# Patient Record
Sex: Male | Born: 1980 | Hispanic: Yes | Marital: Single | Smoking: Current every day smoker
Health system: Southern US, Community
[De-identification: ages and names within clinical notes are randomized; demographics above are authoritative.]

---

## 2018-08-01 ENCOUNTER — Other Ambulatory Visit: Payer: Self-pay

## 2018-08-01 ENCOUNTER — Emergency Department (HOSPITAL_COMMUNITY)
Admission: EM | Admit: 2018-08-01 | Discharge: 2018-08-01 | Disposition: A | Discharge status: Home / Self Care | Payer: Self-pay | Attending: Emergency Medicine | Admitting: Emergency Medicine

## 2018-08-01 ENCOUNTER — Encounter (HOSPITAL_COMMUNITY): Payer: Self-pay | Admitting: *Deleted

## 2018-08-01 ENCOUNTER — Emergency Department (HOSPITAL_COMMUNITY): Payer: Self-pay

## 2018-08-01 DIAGNOSIS — W260XXA Contact with knife, initial encounter: Secondary | ICD-10-CM | POA: Insufficient documentation

## 2018-08-01 DIAGNOSIS — S41111A Laceration without foreign body of right upper arm, initial encounter: Secondary | ICD-10-CM

## 2018-08-01 DIAGNOSIS — Y9301 Activity, walking, marching and hiking: Secondary | ICD-10-CM | POA: Insufficient documentation

## 2018-08-01 DIAGNOSIS — Y999 Unspecified external cause status: Secondary | ICD-10-CM | POA: Insufficient documentation

## 2018-08-01 DIAGNOSIS — Y929 Unspecified place or not applicable: Secondary | ICD-10-CM | POA: Insufficient documentation

## 2018-08-01 DIAGNOSIS — S4991XA Unspecified injury of right shoulder and upper arm, initial encounter: Secondary | ICD-10-CM

## 2018-08-01 DIAGNOSIS — Z23 Encounter for immunization: Secondary | ICD-10-CM | POA: Insufficient documentation

## 2018-08-01 DIAGNOSIS — S51811A Laceration without foreign body of right forearm, initial encounter: Secondary | ICD-10-CM | POA: Insufficient documentation

## 2018-08-01 DIAGNOSIS — F17210 Nicotine dependence, cigarettes, uncomplicated: Secondary | ICD-10-CM | POA: Insufficient documentation

## 2018-08-01 MED ORDER — TETANUS-DIPHTH-ACELL PERTUSSIS 5-2.5-18.5 LF-MCG/0.5 IM SUSP
0.5000 mL | Freq: Once | INTRAMUSCULAR | Status: AC
  Administered 2018-08-01: 0.5 mL via INTRAMUSCULAR
  Filled 2018-08-01: qty 0.5

## 2018-08-01 MED ORDER — LIDOCAINE-EPINEPHRINE (PF) 2 %-1:200000 IJ SOLN
10.0000 mL | Freq: Once | INTRAMUSCULAR | Status: AC
  Administered 2018-08-01: 10 mL via INTRADERMAL
  Filled 2018-08-01: qty 20

## 2018-08-01 NOTE — ED Triage Notes (Signed)
Unable to locate the pt in the waiting room

## 2018-08-01 NOTE — ED Notes (Signed)
Patient transported to X-ray 
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
## 2018-08-12 ENCOUNTER — Encounter (HOSPITAL_COMMUNITY): Payer: Self-pay

## 2018-08-12 ENCOUNTER — Emergency Department (HOSPITAL_COMMUNITY)
Admission: EM | Admit: 2018-08-12 | Discharge: 2018-08-12 | Disposition: A | Discharge status: Home / Self Care | Payer: Self-pay | Attending: Emergency Medicine | Admitting: Emergency Medicine

## 2018-08-12 DIAGNOSIS — W260XXA Contact with knife, initial encounter: Secondary | ICD-10-CM | POA: Insufficient documentation

## 2018-08-12 DIAGNOSIS — Z4802 Encounter for removal of sutures: Secondary | ICD-10-CM | POA: Insufficient documentation

## 2018-08-12 DIAGNOSIS — S40021A Contusion of right upper arm, initial encounter: Secondary | ICD-10-CM | POA: Insufficient documentation

## 2018-08-12 DIAGNOSIS — F17200 Nicotine dependence, unspecified, uncomplicated: Secondary | ICD-10-CM | POA: Insufficient documentation

## 2018-08-12 DIAGNOSIS — Y998 Other external cause status: Secondary | ICD-10-CM | POA: Insufficient documentation

## 2018-08-12 DIAGNOSIS — Y929 Unspecified place or not applicable: Secondary | ICD-10-CM | POA: Insufficient documentation

## 2018-08-12 DIAGNOSIS — Y939 Activity, unspecified: Secondary | ICD-10-CM | POA: Insufficient documentation

## 2018-08-12 NOTE — ED Notes (Signed)
Dressing applied to arm.

## 2018-08-12 NOTE — Discharge Instructions (Signed)
You have a hematoma above where your sutures were removed.  I am concerned you may have had a small injury to your muscle associated with the laceration I would like for you to follow-up with orthopedics for further evaluation of this.  Keep the area clean and dressed with a compressive dressing.  Return to the emergency department if you have redness, swelling, warmth, fevers or any other new or concerning symptoms.

## 2018-08-12 NOTE — ED Triage Notes (Signed)
Pt presents for removal of stitches to R upper arm/elbow.
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
